# Patient Record
Sex: Male | Born: 1990 | Race: White | Hispanic: No | Marital: Single | Smoking: Former smoker
Health system: Southern US, Community
[De-identification: ages and names within clinical notes are randomized; demographics above are authoritative.]

## PROBLEM LIST (undated history)

## (undated) DIAGNOSIS — K8020 Calculus of gallbladder without cholecystitis without obstruction: Secondary | ICD-10-CM

## (undated) HISTORY — PX: TONSILLECTOMY: SUR1361

## (undated) HISTORY — PX: HERNIA REPAIR: SHX51

## (undated) HISTORY — PX: CHOLECYSTECTOMY: SHX55

## (undated) HISTORY — DX: Calculus of gallbladder without cholecystitis without obstruction: K80.20

---

## 2020-03-27 ENCOUNTER — Other Ambulatory Visit: Payer: Self-pay

## 2020-03-27 ENCOUNTER — Emergency Department (HOSPITAL_BASED_OUTPATIENT_CLINIC_OR_DEPARTMENT_OTHER)
Admission: EM | Admit: 2020-03-27 | Discharge: 2020-03-27 | Disposition: A | Discharge status: Home / Self Care | Payer: No Typology Code available for payment source | Attending: Emergency Medicine | Admitting: Emergency Medicine

## 2020-03-27 ENCOUNTER — Encounter (HOSPITAL_BASED_OUTPATIENT_CLINIC_OR_DEPARTMENT_OTHER): Payer: Self-pay

## 2020-03-27 ENCOUNTER — Emergency Department (HOSPITAL_BASED_OUTPATIENT_CLINIC_OR_DEPARTMENT_OTHER): Payer: Managed Care, Other (non HMO) | Attending: Emergency Medicine

## 2020-03-27 DIAGNOSIS — S62663A Nondisplaced fracture of distal phalanx of left middle finger, initial encounter for closed fracture: Secondary | ICD-10-CM | POA: Diagnosis not present

## 2020-03-27 DIAGNOSIS — Z23 Encounter for immunization: Secondary | ICD-10-CM | POA: Insufficient documentation

## 2020-03-27 DIAGNOSIS — Y929 Unspecified place or not applicable: Secondary | ICD-10-CM | POA: Insufficient documentation

## 2020-03-27 DIAGNOSIS — S61313A Laceration without foreign body of left middle finger with damage to nail, initial encounter: Secondary | ICD-10-CM | POA: Diagnosis present

## 2020-03-27 DIAGNOSIS — W231XXA Caught, crushed, jammed, or pinched between stationary objects, initial encounter: Secondary | ICD-10-CM | POA: Insufficient documentation

## 2020-03-27 DIAGNOSIS — Y990 Civilian activity done for income or pay: Secondary | ICD-10-CM | POA: Diagnosis not present

## 2020-03-27 DIAGNOSIS — Y9389 Activity, other specified: Secondary | ICD-10-CM | POA: Insufficient documentation

## 2020-03-27 DIAGNOSIS — Z87891 Personal history of nicotine dependence: Secondary | ICD-10-CM | POA: Diagnosis not present

## 2020-03-27 DIAGNOSIS — S62639B Displaced fracture of distal phalanx of unspecified finger, initial encounter for open fracture: Secondary | ICD-10-CM

## 2020-03-27 MED ORDER — LIDOCAINE HCL (PF) 1 % IJ SOLN
10.0000 mL | Freq: Once | INTRAMUSCULAR | Status: AC
  Administered 2020-03-27: 10 mL
  Filled 2020-03-27: qty 10

## 2020-03-27 MED ORDER — CEFAZOLIN SODIUM-DEXTROSE 2-4 GM/100ML-% IV SOLN
2.0000 g | INTRAVENOUS | Status: AC
  Administered 2020-03-27: 2 g via INTRAVENOUS

## 2020-03-27 MED ORDER — TETANUS-DIPHTH-ACELL PERTUSSIS 5-2.5-18.5 LF-MCG/0.5 IM SUSP
INTRAMUSCULAR | Status: AC
  Administered 2020-03-27: 0.5 mL via INTRAMUSCULAR
  Filled 2020-03-27: qty 0.5

## 2020-03-27 MED ORDER — TETANUS-DIPHTH-ACELL PERTUSSIS 5-2.5-18.5 LF-MCG/0.5 IM SUSP: 0.5000 mL | Freq: Once | INTRAMUSCULAR | Status: DC

## 2020-03-27 MED ORDER — HYDROCODONE-ACETAMINOPHEN 5-325 MG PO TABS: 1.0000 | ORAL_TABLET | Freq: Four times a day (QID) | ORAL | 0 refills | Status: DC | PRN

## 2020-03-27 MED ORDER — TETANUS-DIPHTH-ACELL PERTUSSIS 5-2.5-18.5 LF-MCG/0.5 IM SUSP: 0.5000 mL | Freq: Once | INTRAMUSCULAR | Status: AC

## 2020-03-27 MED ORDER — CEPHALEXIN 500 MG PO CAPS
500.0000 mg | ORAL_CAPSULE | Freq: Four times a day (QID) | ORAL | 0 refills | Status: AC
Start: 2020-03-27 — End: 2020-04-03

## 2020-03-27 MED ORDER — CEFAZOLIN SODIUM-DEXTROSE 2-4 GM/100ML-% IV SOLN
INTRAVENOUS | Status: AC
  Filled 2020-03-27: qty 100

## 2020-03-27 MED ORDER — HYDROCODONE-ACETAMINOPHEN 5-325 MG PO TABS
1.0000 | ORAL_TABLET | Freq: Once | ORAL | Status: AC
  Administered 2020-03-27: 1 via ORAL
  Filled 2020-03-27: qty 1

## 2020-03-27 NOTE — Discharge Instructions (Signed)
Please call Dr. Debby Bud office Monday morning (08/16) to schedule a follow up appointment. Dr. Izora Ribas can remove the sutures himself.   Wear splint on finger and keep wound clean and dry until you can see Dr. Izora Ribas. You can wash your hand with warm soap and water however I would recommend keeping it covered to keep it clean.   Pick up antibiotics and take as prescribed. It is important to finish the entire course. I have also prescribed a short course of pain medication to take as needed.   Return to the ED sooner for any signs of infection including redness/swelling around the wound, drainage of pus, or if you develop fevers > 100.4

## 2020-03-27 NOTE — ED Provider Notes (Signed)
[facility] EMERGENCY DEPARTMENT Provider Note   CSN: [id number] Arrival date & time: 03/27/20  1410     History Chief Complaint  Patient presents with  . Finger Injury    Allen Henderson is a 29 y.o. male who is R hand dominant presents to the ED today with complaint of sudden onset, constant, throbbing, pain s/2 injury to L middle finger that occurred around 1:30 PM today. Pt was at work when his finger got caught between a gear and a belt on a large motor causing a laceration. Bleeding controlled at time of triage however started bleeding once dressing applied. Tetanus status unknown. Pt denies any tingling or loss of sensation to his finger. He has not taken anything for the pain. No other complaints at this time.   The history is provided by the patient and medical records.       History reviewed. No pertinent past medical history.  There are no problems to display for this patient.   Past Surgical History:  Procedure Laterality Date  . CHOLECYSTECTOMY    . HERNIA REPAIR    . TONSILLECTOMY         No family history on file.  Social History   Tobacco Use  . Smoking status: Former Games developer  . Smokeless tobacco: Never Used  Vaping Use  . Vaping Use: Every day  Substance Use Topics  . Alcohol use: Yes    Comment: occ  . Drug use: Never    Home Medications Prior to Admission medications   Medication Sig Start Date End Date Taking? Authorizing Provider  cephALEXin (KEFLEX) 500 MG capsule Take 1 capsule (500 mg total) by mouth 4 (four) times daily for 7 days. 03/27/20 04/03/20  Tanda Rockers, PA-C  HYDROcodone-acetaminophen (NORCO/VICODIN) 5-325 MG tablet Take 1 tablet by mouth every 6 (six) hours as needed for severe pain. 03/27/20   Tanda Rockers, PA-C    Allergies    Patient has no known allergies.  Review of Systems   Review of Systems  Constitutional: Negative for chills and fever.  Musculoskeletal: Positive for arthralgias.  Skin: Positive for  wound.  Neurological: Negative for weakness and numbness.  All other systems reviewed and are negative.   Physical Exam Updated Vital Signs BP 127/64 (BP Location: Right Arm)   Pulse (!) 59   Temp 98.2 F (36.8 C) (Oral)   Resp 19   SpO2 100%   Physical Exam Vitals and nursing note reviewed.  Constitutional:      Appearance: He is not ill-appearing.  HENT:     Head: Normocephalic and atraumatic.  Eyes:     Conjunctiva/sclera: Conjunctivae normal.  Cardiovascular:     Rate and Rhythm: Normal rate and regular rhythm.  Pulmonary:     Effort: Pulmonary effort is normal.     Breath sounds: Normal breath sounds.  Abdominal:     Palpations: Abdomen is soft.     Tenderness: There is no abdominal tenderness.  Musculoskeletal:     Cervical back: Neck supple.     Comments: See photo below. 1.5 cm laceration to distal aspect of L 3rd digit with intact nail avulsed from nail bed. Nail with associated subungual hematoma. ROM intact to MCP, PIP, and DIP joint of same finger. 2+ radial pulse.   Skin:    General: Skin is warm and dry.  Neurological:     Mental Status: He is alert.          ED Results / Procedures / Treatments  Labs (all labs ordered are listed, but only abnormal results are displayed) Labs Reviewed - No data to display  EKG None  Radiology DG Finger Middle Left  Result Date: 03/27/2020 CLINICAL DATA:  29 year old male with trauma to the third digit. EXAM: LEFT MIDDLE FINGER 2+V COMPARISON:  None. FINDINGS: There is a mildly displaced fracture of the tuft of the distal phalanx of the third digit. There is no dislocation. There is laceration of the soft tissues of the tip of the third digit. No radiopaque foreign object. Dressing noted over the third digit. IMPRESSION: Mildly displaced fracture of the tuft of the distal phalanx of the third digit. Electronically Signed   By: Elgie Collard M.D.   On: 03/27/2020 15:52    Procedures .Marland KitchenLaceration  Repair  Date/Time: 03/27/2020 6:38 PM Performed by: Tanda Rockers, PA-C Authorized by: Tanda Rockers, PA-C   Consent:    Consent obtained:  Verbal   Consent given by:  Patient   Risks discussed:  Infection, pain and poor cosmetic result Anesthesia (see MAR for exact dosages):    Anesthesia method:  Nerve block   Block location:  Left 3rd finger   Block needle gauge:  25 G   Block anesthetic:  Lidocaine 1% w/o epi   Block injection procedure:  Anatomic landmarks identified   Block outcome:  Anesthesia achieved Laceration details:    Location:  Finger   Finger location:  L long finger   Length (cm):  3   Depth (mm):  3 Repair type:    Repair type:  Complex Pre-procedure details:    Preparation:  Patient was prepped and draped in usual sterile fashion Exploration:    Hemostasis achieved with:  Direct pressure   Wound exploration: wound explored through full range of motion and entire depth of wound probed and visualized   Treatment:    Area cleansed with:  Betadine   Irrigation solution:  Sterile saline Skin repair:    Repair method:  Sutures   Suture size:  4-0   Suture material:  Prolene   Suture technique:  Simple interrupted   Number of sutures:  8 Approximation:    Approximation:  Close Post-procedure details:    Dressing:  Splint for protection and non-adherent dressing   Patient tolerance of procedure:  Tolerated well, no immediate complications   (including critical care time)  Medications Ordered in ED Medications  ceFAZolin (ANCEF) IVPB 2g/100 mL premix (2 g Intravenous New Bag/Given 03/27/20 1711)  lidocaine (PF) (XYLOCAINE) 1 % injection 10 mL (10 mLs Infiltration Given 03/27/20 1718)  Tdap (BOOSTRIX) injection 0.5 mL (0.5 mLs Intramuscular Given 03/27/20 1712)  HYDROcodone-acetaminophen (NORCO/VICODIN) 5-325 MG per tablet 1 tablet (1 tablet Oral Given 03/27/20 1717)    ED Course  I have reviewed the triage vital signs and the nursing notes.  Pertinent  labs & imaging results that were available during my care of the patient were reviewed by me and considered in my medical decision making (see chart for details).    MDM Rules/Calculators/A&P                          29 year old male presents the ED after sustaining a injury to his left middle finger while at work.  He presents with a 1 cm laceration with involvement into the nailbed, intact nail is fully avulsed and hanging on by a small thread of tissue.  Tetanus status unknown.  An x-ray was obtained while patient was  in the waiting room which does show a small distal tuft fracture.  Given this patient is positive for an open fracture, will start Ancef at this time.  Will update tetanus.  We will plan to discuss case with hand surgery to obtain close follow-up however patient will need sutures, will plan to tack nail against skin for proper healing.  Discussed case with hand surgeon Dr. Izora Ribas who recommends patient follow-up in the office next week.   Wound repaired; nail placed back into nail bed fold and stitched to skin. Pt received tetanus, ancef, and pain meds in the ED. Will plan to discharge with keflex as well as short course of pain medication. Pt instructed to keep splint on until he can follow up with Dr. Izora Ribas. He is in agreement with plan and stable for discharge home.   This note was prepared using Dragon voice recognition software and may include unintentional dictation errors due to the inherent limitations of voice recognition software.  Final Clinical Impression(s) / ED Diagnoses Final diagnoses:  Laceration of left middle finger without foreign body with damage to nail, initial encounter  Open fracture of tuft of distal phalanx of finger    Rx / DC Orders ED Discharge Orders         Ordered    cephALEXin (KEFLEX) 500 MG capsule  4 times daily     Discontinue  Reprint     03/27/20 1843    HYDROcodone-acetaminophen (NORCO/VICODIN) 5-325 MG tablet  Every 6 hours PRN      Discontinue  Reprint     03/27/20 1843           Discharge Instructions     Please call Dr. Debby Bud office Monday morning (08/16) to schedule a follow up appointment. Dr. Izora Ribas can remove the sutures himself.   Wear splint on finger and keep wound clean and dry until you can see Dr. Izora Ribas. You can wash your hand with warm soap and water however I would recommend keeping it covered to keep it clean.   Pick up antibiotics and take as prescribed. It is important to finish the entire course. I have also prescribed a short course of pain medication to take as needed.   Return to the ED sooner for any signs of infection including redness/swelling around the wound, drainage of pus, or if you develop fevers > 100.4       Tanda Rockers, PA-C 03/27/20 1845    Alvira Monday, MD 03/28/20 1108

## 2020-03-27 NOTE — ED Triage Notes (Addendum)
Pt states his finger got caught between "gear and a belt on a motor" at work 110pm-nail partially avulsed-saline wet/dry 4x4/kling dsg applied-NAD-steady gait

## 2020-08-15 DIAGNOSIS — D649 Anemia, unspecified: Secondary | ICD-10-CM

## 2020-08-15 HISTORY — DX: Anemia, unspecified: D64.9

## 2021-03-15 ENCOUNTER — Encounter: Payer: Self-pay | Admitting: Nurse Practitioner

## 2021-04-13 ENCOUNTER — Telehealth: Payer: Self-pay | Admitting: Nurse Practitioner

## 2021-04-13 ENCOUNTER — Ambulatory Visit (INDEPENDENT_AMBULATORY_CARE_PROVIDER_SITE_OTHER): Payer: Managed Care, Other (non HMO) | Admitting: Nurse Practitioner

## 2021-04-13 ENCOUNTER — Encounter: Payer: Self-pay | Admitting: Nurse Practitioner

## 2021-04-13 VITALS — BP 120/66 | HR 60 | Ht 76.0 in | Wt 243.0 lb

## 2021-04-13 DIAGNOSIS — K529 Noninfective gastroenteritis and colitis, unspecified: Secondary | ICD-10-CM

## 2021-04-13 DIAGNOSIS — D649 Anemia, unspecified: Secondary | ICD-10-CM | POA: Insufficient documentation

## 2021-04-13 NOTE — Telephone Encounter (Signed)
Pt called to give the fax number to send cardiac clearance form to his cardiologist Dr. Hanley Hays at [place]. Fax is [phone]. Pt is also asking for a work excuse letter for today since he missed work. Pls fax letter to [phone].

## 2021-04-13 NOTE — Progress Notes (Signed)
Reviewed and agree with documentation and assessment and plan. K. Veena Kaylamarie Swickard , MD   

## 2021-04-13 NOTE — Progress Notes (Signed)
04/13/2021 Allen Henderson [id number] May 31, 1991   CHIEF COMPLAINT: Chronic diarrhea   HISTORY OF PRESENT ILLNESS: Allen Henderson is a 30 year old male with a past medical history of chronic diarrhea since age 26. S/P cholecystectomy secondary to gallstones 2006 and hernia surgery 1995. He presents to our office today as recommended by the urgent care physician for further evaluation regarding microcytic anemia and chronic diarrhea. He reports having chronic diarrhea since he was 30 years old.  He denies ever seeing a gastroenterologist as a child or as an adult.  He described passing nonbloody watery diarrhea 12-15 times daily since the age of 44.  No significant associated abdominal pain.  He generally felt unwell with persistent diarrhea and [facility] approximately 1 month ago.  He stated laboratory studies done at that time showed he had anemia and he was prescribed Ferrous Sulfate 350m QD and he was advised to schedule a GI evaluation including a colonoscopy.  His diarrhea has decreased since starting oral iron.  He is now passing 5-6 brown loose mud like stools daily.  His stools are darker in color since starting oral iron.  He last passed a solid stool 4 years ago.  He drinks nondairy milk and occasionally eats ice cream.  He avoids fatty and greasy foods.  He eats red meat and somewhat adheres to a vegan diet.  No recent antibiotics.  He infrequently takes NSAIDs.  No known family history of celiac disease or IBD.  He stated undergoing negative celiac blood testing done at the urgent care clinic.  No heartburn.  No nausea or vomiting.  No weight loss.  He denies ever taking Imodium or other antidiarrheal medication.  He developed chest pain  x 1 week and [facility]  Earlier that morning, he continued to have left chest pain with associated shortness of breath and a headache.  Labs in the ED showed  a negative SARS coronavirus test. WBC 6.9.  Hemoglobin 13.2.  Hematocrit 40.9.  MCV 72.6.  Platelet 181.  Troponin less than 3.  Sodium 139.  Potassium 4.3.  BUN 15.  Creatinine 1.08.  Total bili 1.2.  Alk phos 62.  AST 23.  ALT 31.  Chest x-ray was negative.  Twelve-lead EKG showed a normal sinus rhythm without evidence of acute ischemia.  He was assessed to have pleuritic chest pain with dyspnea on exertion.  He was referred to cardiology for further evaluation.  [facility] who ordered a nuclear stress test which has been denied by his insurance carrier.  He denies having any chest pain or shortness of breath at this time.   Past Medical History:  Diagnosis Date   Anemia 2022   Gallstones    Past Surgical History:  Procedure Laterality Date   CHOLECYSTECTOMY     HERNIA REPAIR     as a child   TONSILLECTOMY    He reported awakened during his gallbladder surgery. No problems with airway management or intubation during any past procedure or surgery.  Social History: He is single.  He works for the city of [employer]  He quit smoking cigarettes.  Rare alcohol intake.  No drug use.  Family History: Mother age 62529DM II, neuropathy and stomach issues. Father age 62571seizure disorder. No family history of IBD, celiac disease or esophageal, gastric or colon  cancer.  Maternal grandmother with heart disease.  No Known Allergies    Outpatient Encounter Medications as of 04/13/2021  Medication Sig   ferrous sulfate 325 (65 FE) MG tablet Take 650 mg by mouth daily with breakfast.   Multiple Vitamins-Minerals (MULTI-VITAMIN GUMMIES PO) Take by mouth.   [DISCONTINUED] HYDROcodone-acetaminophen (NORCO/VICODIN) 5-325 MG tablet Take 1 tablet by mouth every 6 (six) hours as needed for severe pain.   No facility-administered encounter medications on file as of 04/13/2021.    REVIEW OF SYSTEMS:  Gen: Denies fever, sweats or chills. No weight loss.  CV:  See HPI.   Resp: Denies cough, shortness of breath of hemoptysis.  GI: See HPI. GU : Denies urinary burning, blood in urine, increased urinary frequency or incontinence. MS: Denies joint pain, muscles aches or weakness. Derm: Denies rash, itchiness, skin lesions or unhealing ulcers. Psych: Denies depression, anxiety or memory loss.  Heme: Denies bruising, bleeding. Neuro:  Denies headaches, dizziness or paresthesias. Endo:  Denies any problems with DM, thyroid or adrenal function.  PHYSICAL EXAM: BP 120/66   Pulse 60   Ht '6\' 4"'  (1.93 m)   Wt 243 lb (110.2 kg)   SpO2 97%   BMI 29.58 kg/m   General: 30 year old male in no acute distress. Head: Normocephalic and atraumatic. Eyes:  Sclerae non-icteric, conjunctive pink. Ears: Normal auditory acuity. Mouth: Dentition intact. No ulcers or lesions.  Neck: Supple, no lymphadenopathy or thyromegaly.  Lungs: Clear bilaterally to auscultation without wheezes, crackles or rhonchi. Heart: Regular rate and rhythm. No murmur, rub or gallop appreciated.  Abdomen: Soft, nontender, non distended. No masses. No hepatosplenomegaly. Normoactive bowel sounds x 4 quadrants.  Rectal: Deferred.  Musculoskeletal: Symmetrical with no gross deformities. Skin: Warm and dry. No rash or lesions on visible extremities. Extremities: No edema. Neurological: Alert oriented x 4, no focal deficits.  Psychological:  Alert and cooperative. Normal mood and affect.  ASSESSMENT AND PLAN:  36) 30 year old male with chronic diarrhea since age 57 -Schedule a diagnostic colonoscopy after cardiac clearance received  -Imodium one tab po bid, hold if not  BM in 24 hours -Probiotic of choice once daily -Consider trial of Cholestyramine if no improvement on Imodium  2) Mild microcytic anemia -Request copy of celiac blood testing patient reported having done 1 month ago at urgent care in [facility] -EGD at time of colonoscopy to rule out celiac disease, UGI Crohn's disease  in setting of microcytic anemia and chronic diarrhea  3) Chest pain with SOB.  He was evaluated by cardiologist at [facility] with recommendations to schedule a nuclear stress test, however, the patient's insurance carrier declined this study. -Cardiac clearance required prior to pursuing endoscopic evaluation  Further recommendations to be determined after EGD and colonoscopy completed      CC:  [employer]

## 2021-04-13 NOTE — Patient Instructions (Addendum)
If you are age 30 or younger, your body mass index should be between 19-25. Your Body mass index is 29.58 kg/m. If this is out of the aformentioned range listed, please consider follow up with your Primary Care Provider.   The [facility] providers would like to encourage you to use [facility] to communicate with providers for non-urgent requests or questions.  Due to long hold times on the telephone, sending your provider a message by [facility] may be faster and more efficient way to get a response. Please allow 48 business hours for a response.  Please remember that this is for non-urgent requests/questions.  RECOMMENDATIONS: You can take Imodium twice a day as needed for diarrhea. Take Vear Clock Bacteria Probiotic once a day, this is over the counter.  We will need cardiac clearance before we can schedule a colonoscopy. We will send for these records. We will also send for the records of the recent testing you had done at Urgent Care. We will contact you once we receive and review these records.  It was great seeing you today! Thank you for entrusting me with your care and choosing [facility].  Arnaldo Natal, CRNP

## 2021-04-14 NOTE — Telephone Encounter (Signed)
Work letter faxed and ROI faxed to cardiology.

## 2021-07-21 ENCOUNTER — Telehealth: Payer: Self-pay

## 2021-07-21 ENCOUNTER — Telehealth: Payer: Self-pay | Admitting: Nurse Practitioner

## 2021-07-21 NOTE — Telephone Encounter (Signed)
Beth,  pls contact the patient's [facility] cardiologist to re-request an official cardiac clearance. Refer to office visit 04/13/2021. Patient reported having CP with SOB with exertions. I received records which showed he had an ECHO done 04/05/2021 which showed a normal LV function 55 - 60 %. He was referred by his PCP Dr. Barney Drain who requested a cardiac stress test and PFTs per his office notes.   Please contact cardiologist Dr. Lollie Marrow and request a cardiac clearance which is required prior to scheduling an EGD and colonoscopy.   Since the patient was seen in office almost 4 months ago, I also think it is prudent to schedule the patient for a follow up appointment for sx update. If his sx have abated, he may not need endoscopic evaluation. THX

## 2021-07-21 NOTE — Telephone Encounter (Signed)
Called patient and left message for patient to please call back. Trying to get an update on his symptoms and schedule on office visit with Noel Gerold NP

## 2021-07-22 ENCOUNTER — Telehealth: Payer: Self-pay

## 2021-07-22 NOTE — Telephone Encounter (Signed)
Called again, to get update on patient's symptoms and give [facility] recommendations. Left message on voice mail to please call back

## 2021-07-29 ENCOUNTER — Telehealth: Payer: Self-pay

## 2021-07-29 NOTE — Telephone Encounter (Signed)
Pre-operative Risk Assessment     Request for surgical clearance:     Endoscopy Procedure  What type of surgery is being performed?     EGD  When is this surgery scheduled?     To be determined  What type of clearance is required ?   Medical  Are there any medications that need to be held prior to surgery and how long? no  Practice name and name of physician performing surgery?      [facility] Dr Harl Bowie, MD  What is your office phone and fax number?      Phone- [phone]  [phone]  Anesthesia type (None, local, MAC, general) ?       MAC

## 2021-07-29 NOTE — Telephone Encounter (Signed)
Faxed to [facility] for clearance.

## 2021-07-29 NOTE — Telephone Encounter (Signed)
Spoke with [facility]. Per request, I have faxed over an official request for clearance.

## 2021-08-03 NOTE — Telephone Encounter (Signed)
Called [facility] again. Asked for a phone number to fax the request for cardiac clearance. Put on hold, then put into a voicemail. I left a message and my desk number for this person.

## 2021-08-04 ENCOUNTER — Telehealth: Payer: Self-pay

## 2021-08-04 NOTE — Telephone Encounter (Signed)
Spoke with staff from [facility]. We have been trying to get cardiac clearance for procedures for this patient. I am informed the patient cancelled all of his cardiac appointments. He did not establish with Dr Hanley Hays, the cardiologist. The patient's PCP is Dr Kathrynn Speed, who also ordered the ECHO that the patient cancelled.  If we pursue a cardiac clearance for procedures, we will need to fax the request to the PCP Dr Kathrynn Speed. Dr Flossie Buffy fax number is [phone].

## 2021-08-04 NOTE — Telephone Encounter (Signed)
Called the patient. No naswer. Left a message that we are unsuccessful in getting cardiac clearance for him from [facility]. We would like the patient to return to [place] (office visit) and see Alcide Evener, NP for follow up and planning for next step.

## 2021-08-05 ENCOUNTER — Telehealth: Payer: Self-pay

## 2021-08-05 ENCOUNTER — Other Ambulatory Visit: Payer: Self-pay

## 2021-08-05 NOTE — Telephone Encounter (Signed)
Pre-operative Risk Assessment     Request for surgical clearance:     Endoscopy Procedure  What type of surgery is being performed?     EGD and colonoscopy  When is this surgery scheduled?     Waiting for clearance to determine date  What type of clearance is required ?   Cardiac / medical  Are there any medications that need to be held prior to surgery and how long? no  Practice name and name of physician performing surgery?      [facility]  Dr Harl Bowie, MD  What is your office phone and fax number?      Phone- [phone]  [phone]  Anesthesia type (None, local, MAC, general) ?       MAC   Diagnosis - anemia and diarrhea  Thank you for your response

## 2021-08-05 NOTE — Telephone Encounter (Signed)
I will send a request for cardiac clearance to the patient's PCP. I am still waiting for the patient to call back to schedule a follow up appointment with [place] to assess if he is still in need of any procedures.

## 2021-08-05 NOTE — Telephone Encounter (Signed)
Letter mailed to the patient

## 2021-08-10 NOTE — Telephone Encounter (Signed)
This has been faxed to patient's PCP Dr Brock Bad with [facility]. Fax [phone].

## 2021-09-05 ENCOUNTER — Telehealth: Payer: Self-pay | Admitting: Nurse Practitioner

## 2021-09-05 NOTE — Telephone Encounter (Signed)
Beth, pls follow up on the requested cardiac clearance. Pls schedule the patient for a follow up appt since he was last seen in office 03/2021. An EGD and colonoscopy were previously recommended after cardiac clearance received. Thx

## 2021-09-06 NOTE — Telephone Encounter (Signed)
Called the patient. No answer. Left a message asking he call me back to discuss.

## 2021-09-14 ENCOUNTER — Telehealth: Payer: Self-pay

## 2021-09-14 NOTE — Telephone Encounter (Signed)
Please send a letter. -thank you.

## 2021-09-14 NOTE — Telephone Encounter (Signed)
Called patient. Again, no answer. Left another message asking the patient to call back to discuss if he did or did not want to pursue this with [place].  Should I notify his referring provider that he is not returning our calls? Send a letter?

## 2021-09-15 NOTE — Telephone Encounter (Signed)
Letter mailed to the patient. See the letters tab for details.

## 2021-11-25 IMAGING — CR DG FINGER MIDDLE 2+V*L*
3 series · 3 of 3 positions shown · non-contrast
Comparison: None.

CLINICAL DATA: 29-year-old male with trauma to the third digit.

EXAM:
LEFT MIDDLE FINGER 2+V

[x finger pa left]
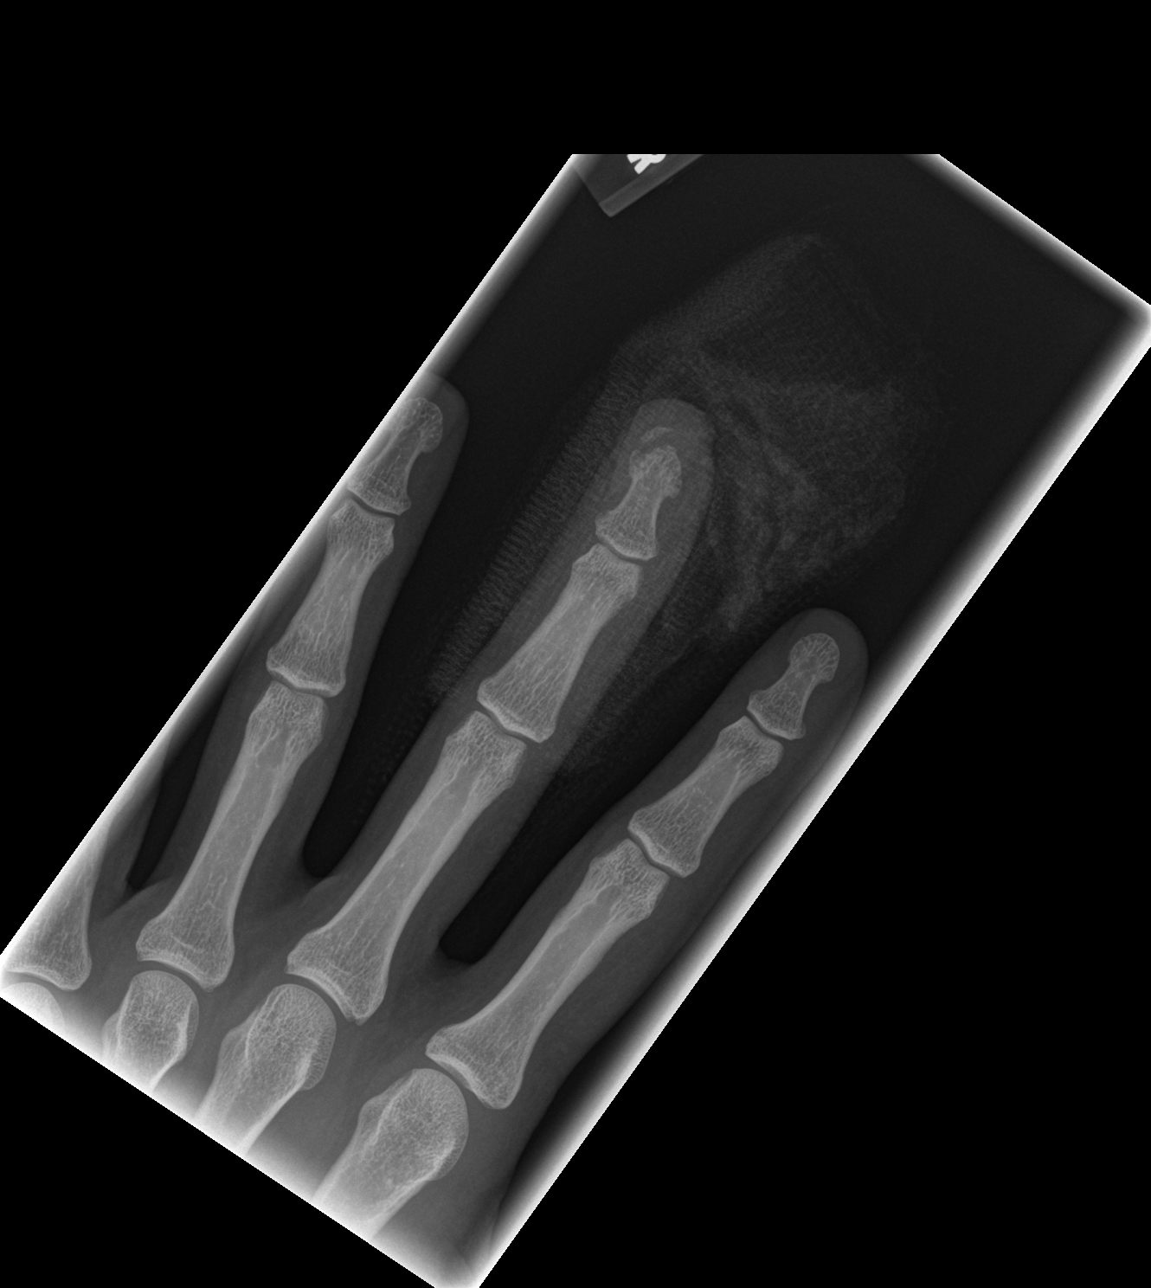

[x finger obl. left]
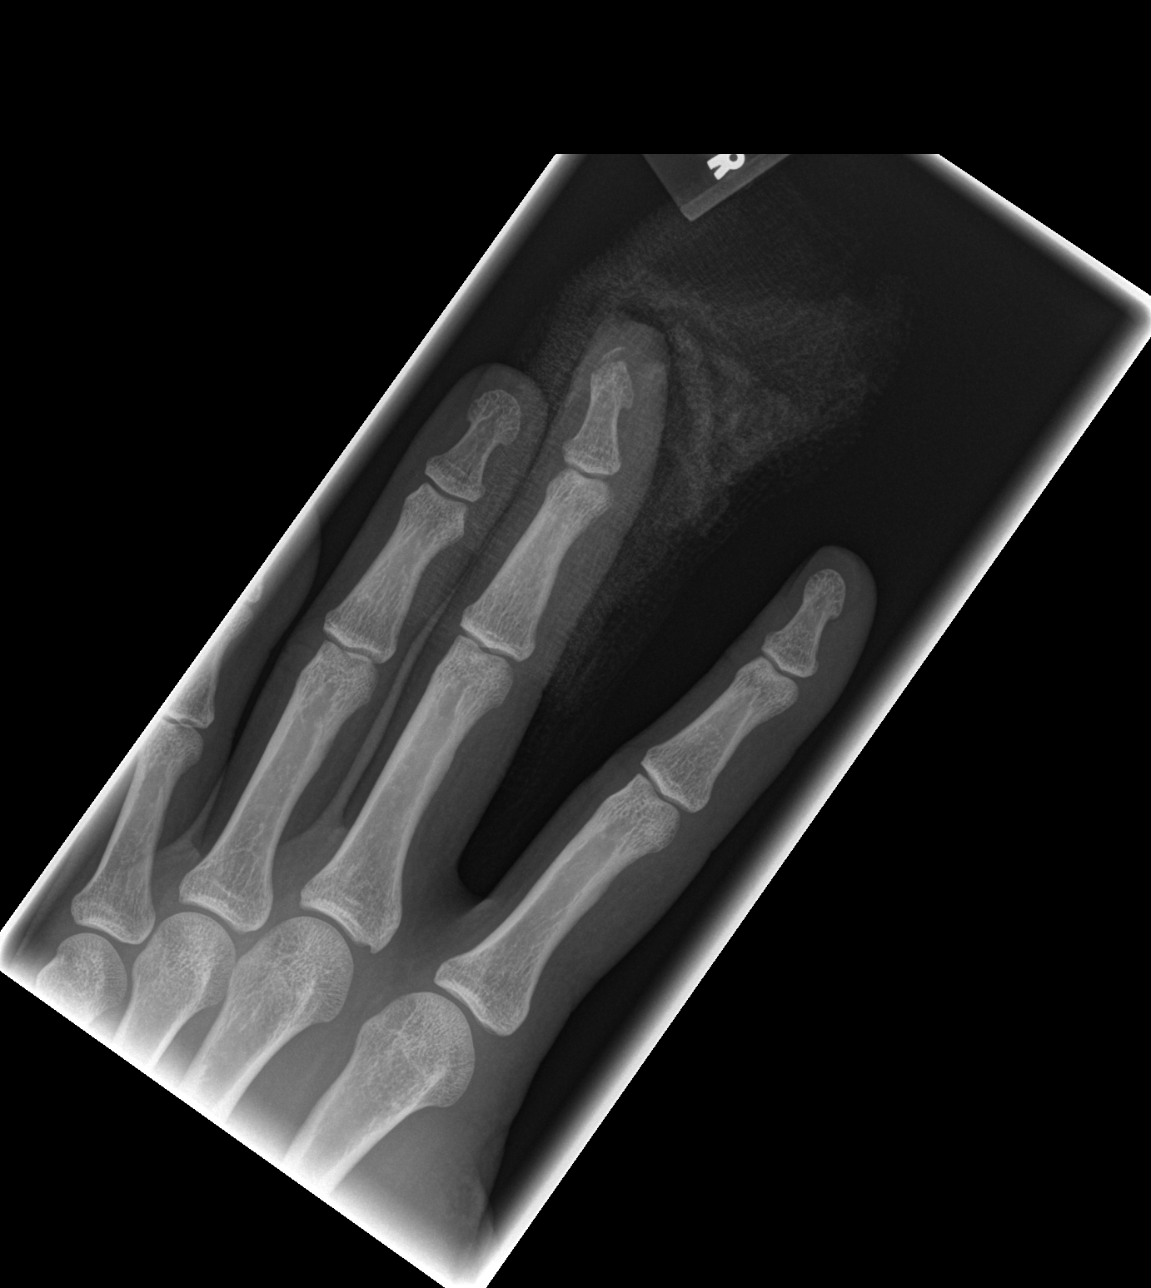

[x finger lateral left]
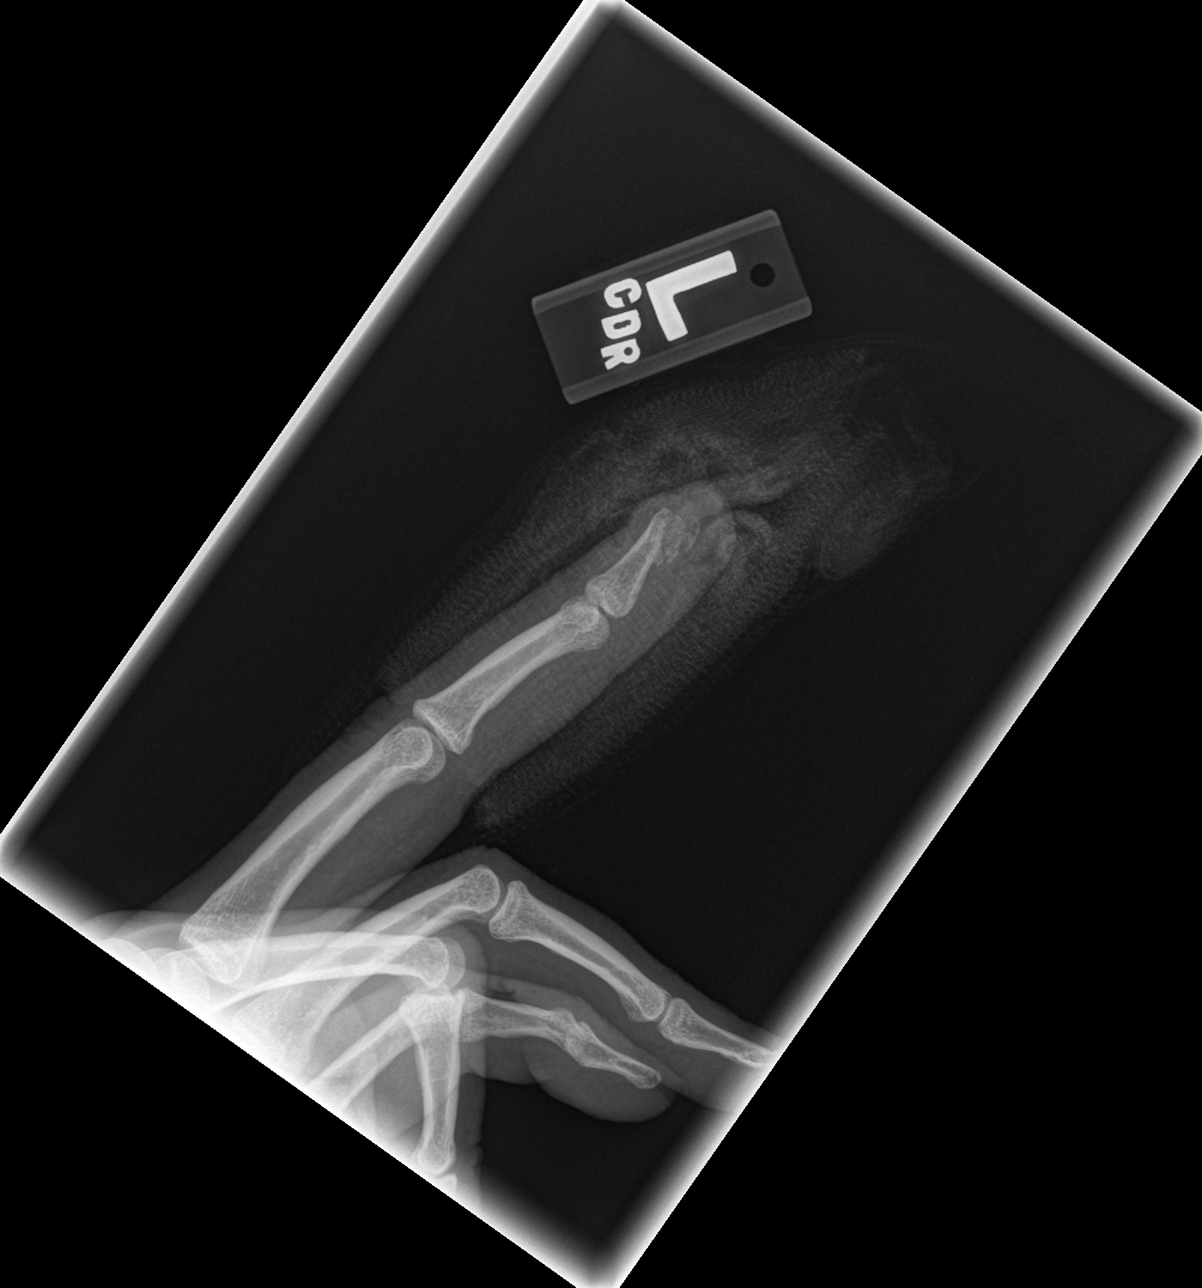

[3 of 3 positions shown; findings below may reference images not displayed]

FINDINGS: There is a mildly displaced fracture of the tuft of the distal
phalanx of the third digit. There is no dislocation. There is
laceration of the soft tissues of the tip of the third digit. No
radiopaque foreign object. Dressing noted over the third digit.
IMPRESSION: Mildly displaced fracture of the tuft of the distal phalanx of the
third digit.
